# Patient Record
Sex: Female | Born: 1960 | Race: White | Hispanic: No | Marital: Married
Health system: Southern US, Community
[De-identification: ages and names within clinical notes are randomized; demographics above are authoritative.]

## PROBLEM LIST (undated history)

## (undated) HISTORY — PX: AUGMENTATION MAMMAPLASTY: SUR837

---

## 2018-02-11 ENCOUNTER — Other Ambulatory Visit: Payer: Self-pay | Admitting: Obstetrics & Gynecology

## 2018-02-11 DIAGNOSIS — Z1231 Encounter for screening mammogram for malignant neoplasm of breast: Secondary | ICD-10-CM

## 2018-03-07 ENCOUNTER — Ambulatory Visit
Admission: RE | Admit: 2018-03-07 | Discharge: 2018-03-07 | Disposition: A | Discharge status: Home / Self Care | Payer: Managed Care, Other (non HMO) | Source: Ambulatory Visit | Attending: Obstetrics & Gynecology | Admitting: Obstetrics & Gynecology

## 2018-03-07 DIAGNOSIS — Z1231 Encounter for screening mammogram for malignant neoplasm of breast: Secondary | ICD-10-CM

## 2018-03-14 ENCOUNTER — Other Ambulatory Visit: Payer: Self-pay | Admitting: Gastroenterology

## 2018-03-14 DIAGNOSIS — R140 Abdominal distension (gaseous): Secondary | ICD-10-CM

## 2018-03-14 DIAGNOSIS — R1084 Generalized abdominal pain: Secondary | ICD-10-CM

## 2018-03-29 ENCOUNTER — Ambulatory Visit
Admission: RE | Admit: 2018-03-29 | Discharge: 2018-03-29 | Disposition: A | Discharge status: Home / Self Care | Payer: Managed Care, Other (non HMO) | Source: Ambulatory Visit | Attending: Gastroenterology | Admitting: Gastroenterology

## 2018-03-29 DIAGNOSIS — R140 Abdominal distension (gaseous): Secondary | ICD-10-CM

## 2018-03-29 DIAGNOSIS — R1084 Generalized abdominal pain: Secondary | ICD-10-CM

## 2018-05-19 ENCOUNTER — Other Ambulatory Visit (HOSPITAL_COMMUNITY): Payer: Self-pay | Admitting: Gastroenterology

## 2018-05-19 DIAGNOSIS — R1111 Vomiting without nausea: Secondary | ICD-10-CM

## 2018-06-07 ENCOUNTER — Ambulatory Visit (HOSPITAL_COMMUNITY)
Admission: RE | Admit: 2018-06-07 | Discharge: 2018-06-07 | Disposition: A | Discharge status: Home / Self Care | Payer: Managed Care, Other (non HMO) | Source: Ambulatory Visit | Attending: Gastroenterology | Admitting: Gastroenterology

## 2018-06-07 DIAGNOSIS — R1111 Vomiting without nausea: Secondary | ICD-10-CM | POA: Diagnosis not present

## 2018-06-07 MED ORDER — TECHNETIUM TC 99M SULFUR COLLOID
2.1000 | Freq: Once | INTRAVENOUS | Status: AC | PRN
  Administered 2018-06-07: 2.1 via ORAL

## 2018-11-27 IMAGING — US US PELVIS COMPLETE TRANSABD/TRANSVAG
1 series · 14 of 25 positions shown · non-contrast
Comparison: None

CLINICAL DATA: Patient with abdominal pain and bloating.

EXAM:
TRANSABDOMINAL AND TRANSVAGINAL ULTRASOUND OF PELVIS
TECHNIQUE: Both transabdominal and transvaginal ultrasound examinations of the
pelvis were performed. Transabdominal technique was performed for
global imaging of the pelvis including uterus, ovaries, adnexal
regions, and pelvic cul-de-sac. It was necessary to proceed with
endovaginal exam following the transabdominal exam to visualize the
endometrium and adnexal structures.

[Series 1: us pelvis complete transabd/transvag · 0.18mm/px · 14 of 63 slices shown]
[im 1/63]
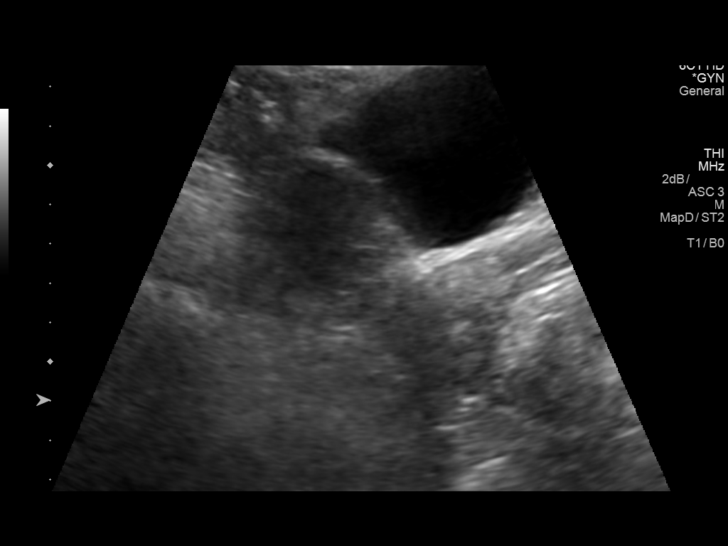
[im 6/63]
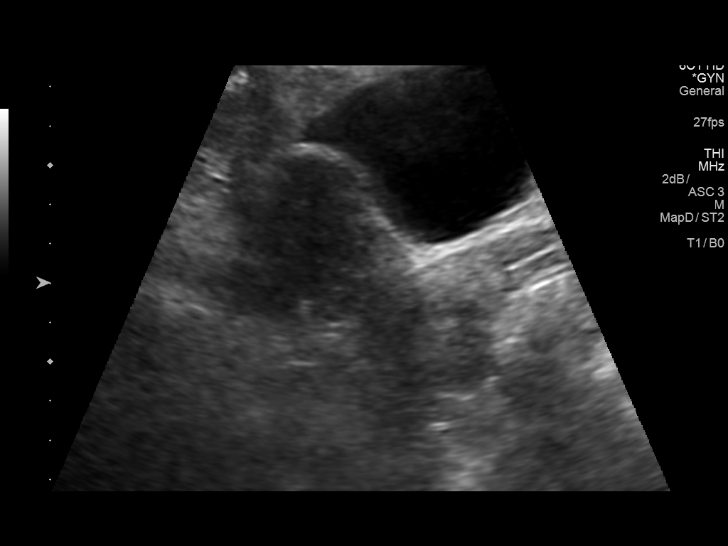
[im 11/63]
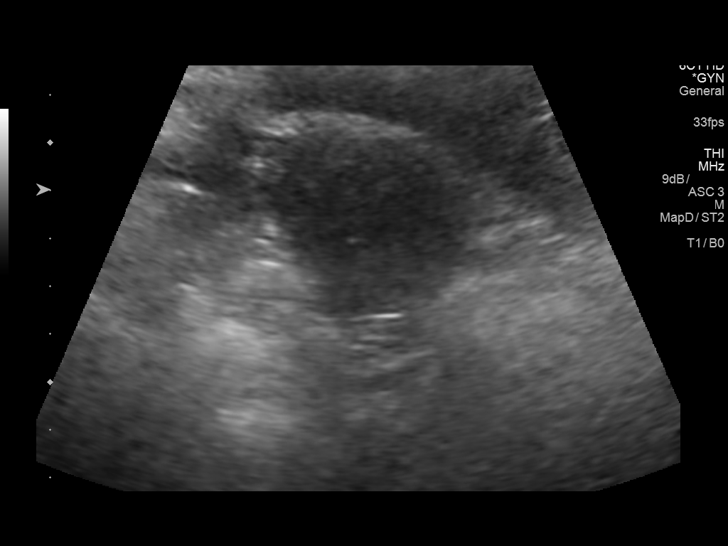
[im 16/63]
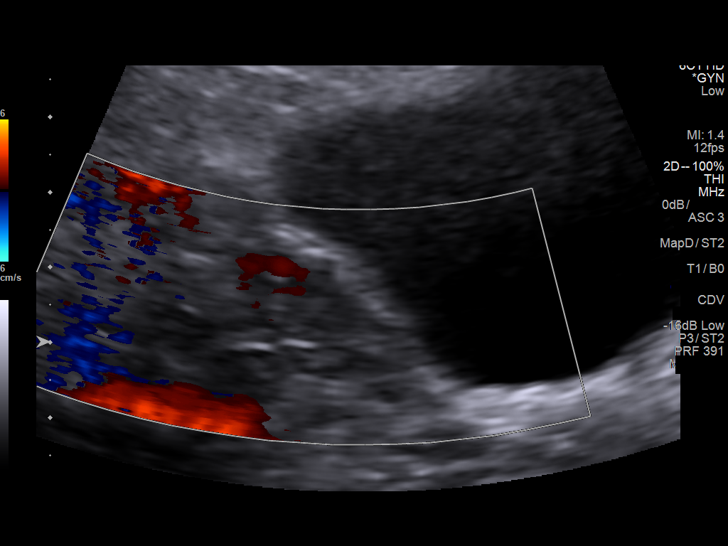
[im 21/63]
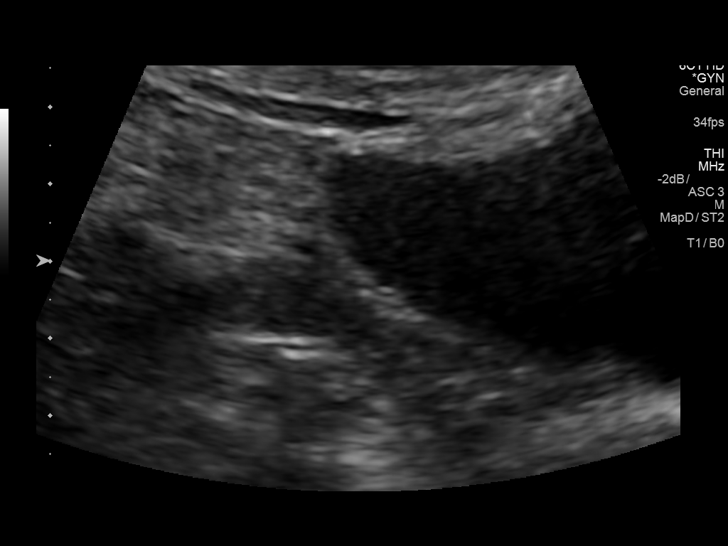
[im 24/63]
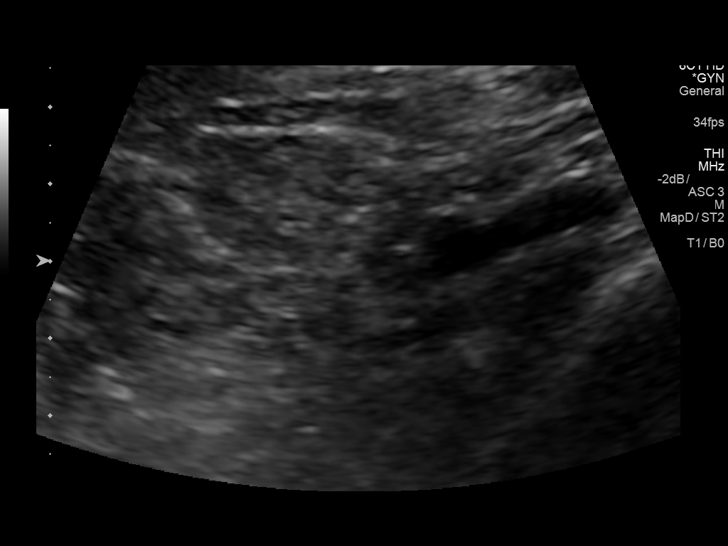
[im 29/63]
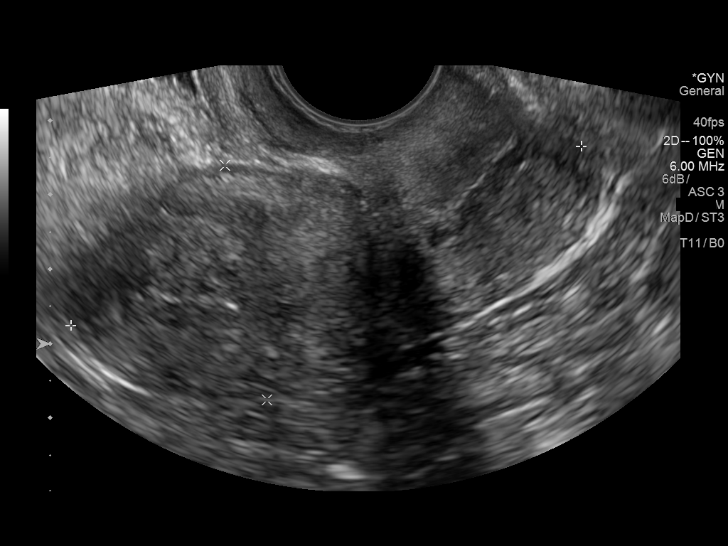
[im 34/63]
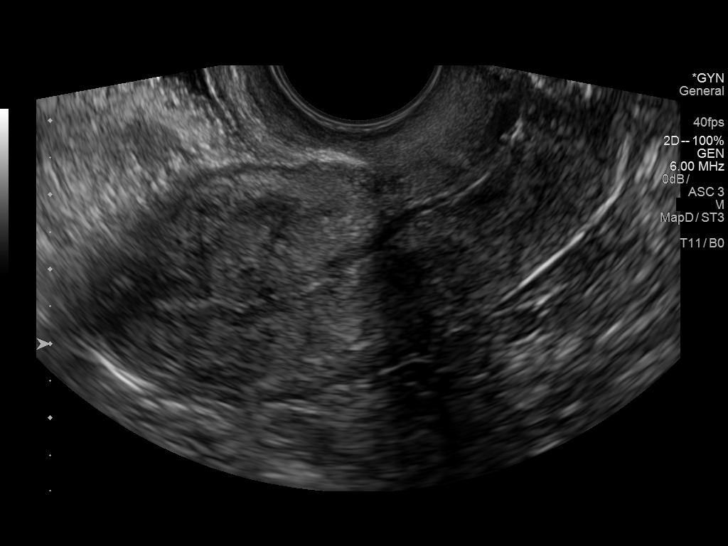
[im 39/63]
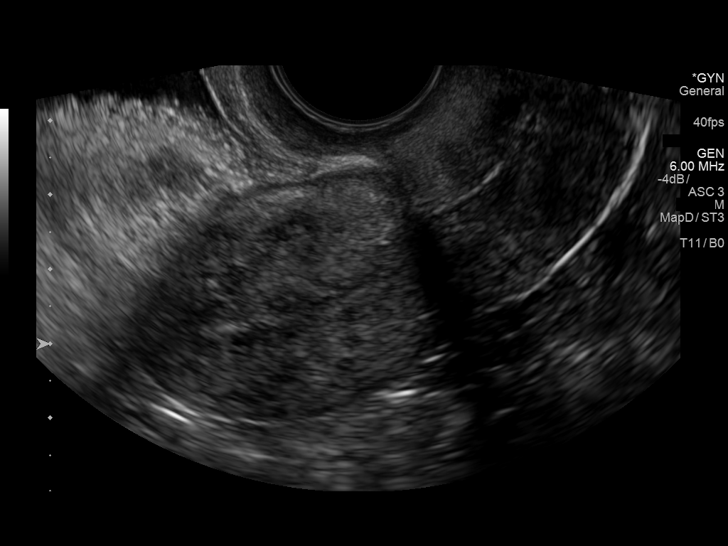
[im 42/63]
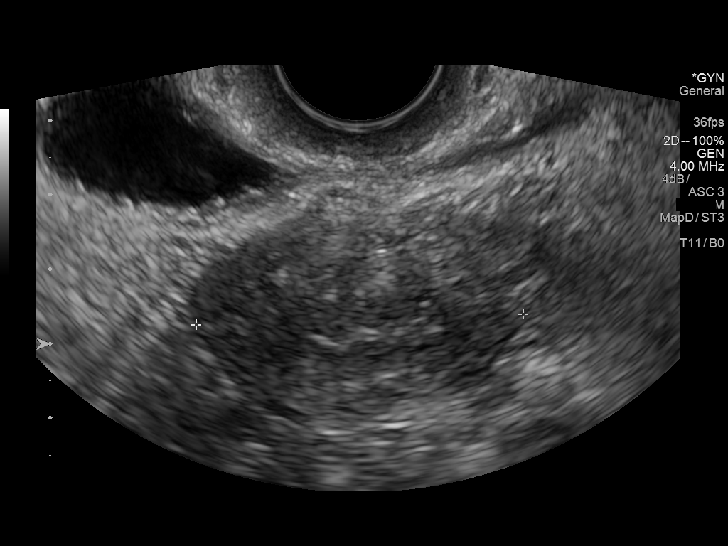
[im 47/63]
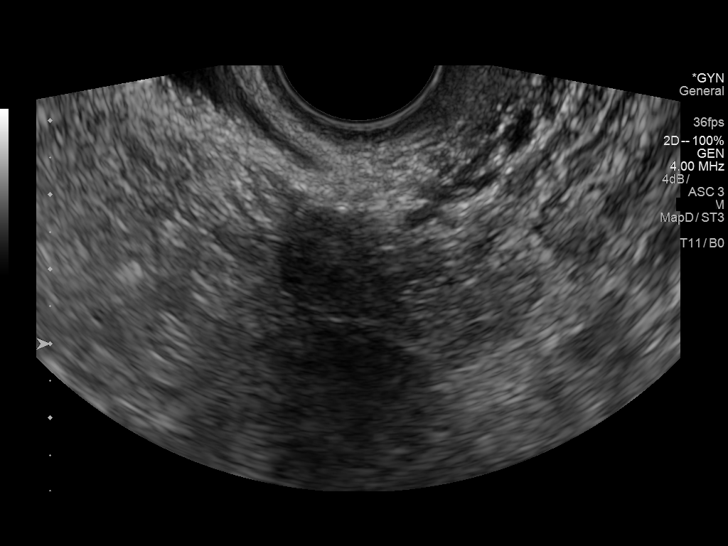
[im 52/63]
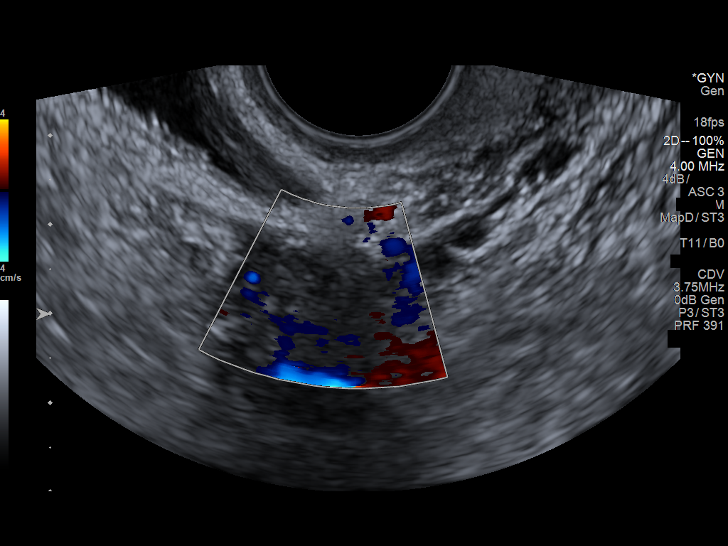
[im 57/63]
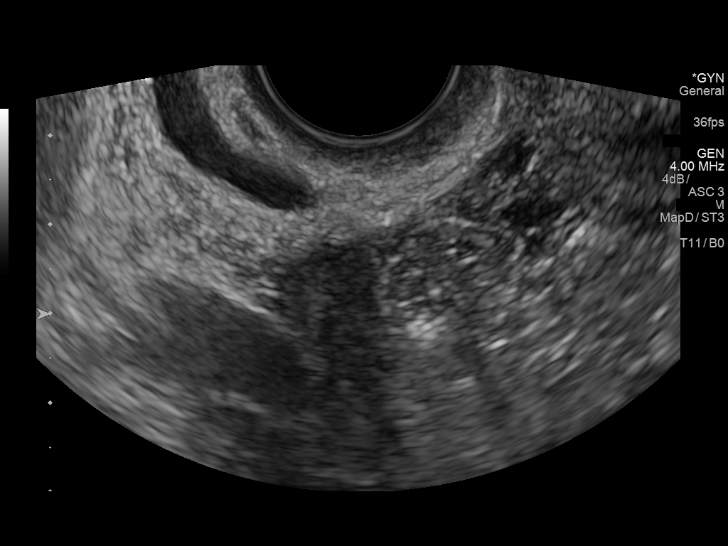
[im 63/63]
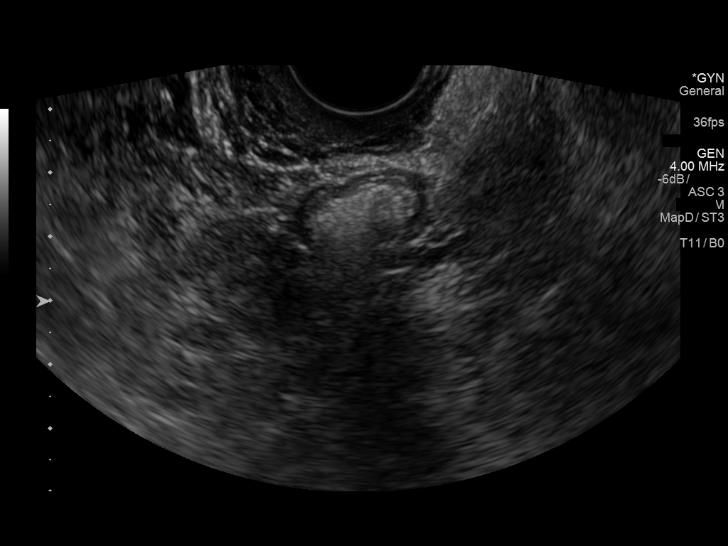

[14 of 25 positions shown; findings below may reference images not displayed]

FINDINGS: Uterus

Measurements: 7.3 x 3.2 x 4.4 cm. No fibroids or other mass
visualized.

Endometrium

Thickness: 2 mm.  No focal abnormality visualized.

Right ovary

Measurements: 2.4 x 1.5 x 1.4 cm. Normal appearance/no adnexal mass.

Left ovary

Not visualized.

Other findings

No abnormal free fluid.
IMPRESSION: No acute process within the pelvis.

## 2019-05-18 ENCOUNTER — Other Ambulatory Visit: Payer: Self-pay | Admitting: Obstetrics & Gynecology

## 2019-05-18 DIAGNOSIS — Z1231 Encounter for screening mammogram for malignant neoplasm of breast: Secondary | ICD-10-CM

## 2019-07-03 ENCOUNTER — Other Ambulatory Visit: Payer: Self-pay

## 2019-07-03 ENCOUNTER — Ambulatory Visit
Admission: RE | Admit: 2019-07-03 | Discharge: 2019-07-03 | Disposition: A | Discharge status: Home / Self Care | Payer: Managed Care, Other (non HMO) | Source: Ambulatory Visit | Attending: Obstetrics & Gynecology | Admitting: Obstetrics & Gynecology

## 2019-07-03 DIAGNOSIS — Z1231 Encounter for screening mammogram for malignant neoplasm of breast: Secondary | ICD-10-CM

## 2020-02-02 ENCOUNTER — Ambulatory Visit: Payer: Self-pay | Attending: Internal Medicine

## 2020-02-02 DIAGNOSIS — Z23 Encounter for immunization: Secondary | ICD-10-CM

## 2020-02-02 NOTE — Progress Notes (Signed)
   Covid-[facility] Vaccination [facility]  Name:  Patricia Doyle    MRN: [id number] DOB: Sep 19, 1961  02/02/2020  [name] was observed post Covid-19 immunization for 15 minutes without incident. She was provided with Vaccine Information Sheet and instruction to access the V-Safe system.   [name] was instructed to call 911 with any severe reactions post vaccine: Marland Kitchen Difficulty breathing  . Swelling of face and throat  . A fast heartbeat  . A bad rash all over body  . Dizziness and weakness   Immunizations Administered    Name Date Dose VIS Date Route   Pfizer COVID-19 Vaccine 02/02/2020 12:42 PM 0.3 mL 10/13/2019 Intramuscular   Manufacturer: [employer], [employer]   Lot: [id number]   NDC: [address]-0

## 2020-02-26 ENCOUNTER — Ambulatory Visit: Payer: Self-pay | Attending: Internal Medicine

## 2020-02-26 DIAGNOSIS — Z23 Encounter for immunization: Secondary | ICD-10-CM

## 2020-02-26 NOTE — Progress Notes (Signed)
   Covid-19 Vaccination Clinic  Name:  Patricia Doyle    MRN: [id number] DOB: [birth year]  02/26/2020  [name] was observed post Covid-19 immunization for 15 minutes without incident. She was provided with Vaccine Information Sheet and instruction to access the V-Safe system.   [name] was instructed to call 911 with any severe reactions post vaccine: Marland Kitchen Difficulty breathing  . Swelling of face and throat  . A fast heartbeat  . A bad rash all over body  . Dizziness and weakness   Immunizations Administered    Name Date Dose VIS Date Route   Pfizer COVID-19 Vaccine 02/26/2020  1:42 PM 0.3 mL 12/27/2018 Intramuscular   Manufacturer: [employer], [employer]   Lot: [id number]   NDC: [address]-1

## 2020-03-19 ENCOUNTER — Other Ambulatory Visit: Payer: Self-pay | Admitting: Family Medicine

## 2020-03-19 DIAGNOSIS — S92902S Unspecified fracture of left foot, sequela: Secondary | ICD-10-CM

## 2020-03-19 DIAGNOSIS — E2839 Other primary ovarian failure: Secondary | ICD-10-CM

## 2020-06-04 ENCOUNTER — Other Ambulatory Visit: Payer: Self-pay | Admitting: Obstetrics & Gynecology

## 2020-06-04 DIAGNOSIS — Z1231 Encounter for screening mammogram for malignant neoplasm of breast: Secondary | ICD-10-CM

## 2020-06-12 ENCOUNTER — Other Ambulatory Visit: Payer: Self-pay

## 2020-06-12 ENCOUNTER — Ambulatory Visit
Admission: RE | Admit: 2020-06-12 | Discharge: 2020-06-12 | Disposition: A | Discharge status: Home / Self Care | Payer: 59 | Source: Ambulatory Visit | Attending: Family Medicine | Admitting: Family Medicine

## 2020-06-12 DIAGNOSIS — E2839 Other primary ovarian failure: Secondary | ICD-10-CM

## 2020-06-12 DIAGNOSIS — S92902S Unspecified fracture of left foot, sequela: Secondary | ICD-10-CM

## 2020-07-09 ENCOUNTER — Ambulatory Visit: Payer: Self-pay

## 2020-07-11 ENCOUNTER — Ambulatory Visit
Admission: RE | Admit: 2020-07-11 | Discharge: 2020-07-11 | Disposition: A | Discharge status: Home / Self Care | Payer: 59 | Source: Ambulatory Visit | Attending: Obstetrics & Gynecology | Admitting: Obstetrics & Gynecology

## 2020-07-11 ENCOUNTER — Other Ambulatory Visit: Payer: Self-pay

## 2020-07-11 DIAGNOSIS — Z1231 Encounter for screening mammogram for malignant neoplasm of breast: Secondary | ICD-10-CM

## 2020-07-15 ENCOUNTER — Other Ambulatory Visit: Payer: Self-pay | Admitting: Obstetrics & Gynecology

## 2020-07-15 DIAGNOSIS — R928 Other abnormal and inconclusive findings on diagnostic imaging of breast: Secondary | ICD-10-CM

## 2020-07-26 ENCOUNTER — Ambulatory Visit
Admission: RE | Admit: 2020-07-26 | Discharge: 2020-07-26 | Disposition: A | Discharge status: Home / Self Care | Payer: 59 | Source: Ambulatory Visit | Attending: Obstetrics & Gynecology | Admitting: Obstetrics & Gynecology

## 2020-07-26 ENCOUNTER — Other Ambulatory Visit: Payer: Self-pay | Admitting: Obstetrics & Gynecology

## 2020-07-26 ENCOUNTER — Other Ambulatory Visit: Payer: Self-pay

## 2020-07-26 DIAGNOSIS — R928 Other abnormal and inconclusive findings on diagnostic imaging of breast: Secondary | ICD-10-CM

## 2021-09-04 ENCOUNTER — Other Ambulatory Visit: Payer: Self-pay | Admitting: Family Medicine

## 2021-09-04 DIAGNOSIS — Z1231 Encounter for screening mammogram for malignant neoplasm of breast: Secondary | ICD-10-CM

## 2021-10-16 ENCOUNTER — Other Ambulatory Visit: Payer: Self-pay | Admitting: Obstetrics and Gynecology

## 2021-10-16 ENCOUNTER — Ambulatory Visit
Admission: RE | Admit: 2021-10-16 | Discharge: 2021-10-16 | Disposition: A | Discharge status: Home / Self Care | Payer: 59 | Source: Ambulatory Visit | Attending: Family Medicine | Admitting: Family Medicine

## 2021-10-16 DIAGNOSIS — Z1231 Encounter for screening mammogram for malignant neoplasm of breast: Secondary | ICD-10-CM

## 2021-11-19 DIAGNOSIS — Z6827 Body mass index (BMI) 27.0-27.9, adult: Secondary | ICD-10-CM | POA: Diagnosis not present

## 2021-11-19 DIAGNOSIS — Z1231 Encounter for screening mammogram for malignant neoplasm of breast: Secondary | ICD-10-CM | POA: Diagnosis not present

## 2021-11-19 DIAGNOSIS — Z01419 Encounter for gynecological examination (general) (routine) without abnormal findings: Secondary | ICD-10-CM | POA: Diagnosis not present

## 2021-11-19 DIAGNOSIS — Z1211 Encounter for screening for malignant neoplasm of colon: Secondary | ICD-10-CM | POA: Diagnosis not present

## 2021-11-19 DIAGNOSIS — R6882 Decreased libido: Secondary | ICD-10-CM | POA: Diagnosis not present

## 2021-11-19 DIAGNOSIS — Z79890 Hormone replacement therapy (postmenopausal): Secondary | ICD-10-CM | POA: Diagnosis not present

## 2021-11-19 DIAGNOSIS — R69 Illness, unspecified: Secondary | ICD-10-CM | POA: Diagnosis not present

## 2022-02-03 ENCOUNTER — Ambulatory Visit
Admission: RE | Admit: 2022-02-03 | Discharge: 2022-02-03 | Disposition: A | Discharge status: Home / Self Care | Payer: 59 | Source: Ambulatory Visit | Attending: Family Medicine | Admitting: Family Medicine

## 2022-02-03 ENCOUNTER — Other Ambulatory Visit: Payer: Self-pay | Admitting: Family Medicine

## 2022-02-03 DIAGNOSIS — R52 Pain, unspecified: Secondary | ICD-10-CM

## 2022-02-03 DIAGNOSIS — G4700 Insomnia, unspecified: Secondary | ICD-10-CM | POA: Diagnosis not present

## 2022-02-03 DIAGNOSIS — Z79890 Hormone replacement therapy (postmenopausal): Secondary | ICD-10-CM | POA: Diagnosis not present

## 2022-02-03 DIAGNOSIS — I10 Essential (primary) hypertension: Secondary | ICD-10-CM | POA: Diagnosis not present

## 2022-02-03 DIAGNOSIS — Z0000 Encounter for general adult medical examination without abnormal findings: Secondary | ICD-10-CM | POA: Diagnosis not present

## 2022-02-03 DIAGNOSIS — Z5181 Encounter for therapeutic drug level monitoring: Secondary | ICD-10-CM | POA: Diagnosis not present

## 2022-02-03 DIAGNOSIS — R739 Hyperglycemia, unspecified: Secondary | ICD-10-CM | POA: Diagnosis not present

## 2022-02-03 DIAGNOSIS — E538 Deficiency of other specified B group vitamins: Secondary | ICD-10-CM | POA: Diagnosis not present

## 2022-02-03 DIAGNOSIS — Z8262 Family history of osteoporosis: Secondary | ICD-10-CM | POA: Diagnosis not present

## 2022-02-03 DIAGNOSIS — M545 Low back pain, unspecified: Secondary | ICD-10-CM | POA: Diagnosis not present

## 2022-02-03 DIAGNOSIS — K2270 Barrett's esophagus without dysplasia: Secondary | ICD-10-CM | POA: Diagnosis not present

## 2022-08-04 DIAGNOSIS — D649 Anemia, unspecified: Secondary | ICD-10-CM | POA: Diagnosis not present

## 2022-09-21 ENCOUNTER — Other Ambulatory Visit: Payer: Self-pay | Admitting: Obstetrics and Gynecology

## 2022-09-21 DIAGNOSIS — Z1231 Encounter for screening mammogram for malignant neoplasm of breast: Secondary | ICD-10-CM

## 2022-11-19 ENCOUNTER — Ambulatory Visit: Payer: 59

## 2022-11-24 DIAGNOSIS — N941 Unspecified dyspareunia: Secondary | ICD-10-CM | POA: Diagnosis not present

## 2022-11-24 DIAGNOSIS — N898 Other specified noninflammatory disorders of vagina: Secondary | ICD-10-CM | POA: Diagnosis not present

## 2022-11-24 DIAGNOSIS — Z01419 Encounter for gynecological examination (general) (routine) without abnormal findings: Secondary | ICD-10-CM | POA: Diagnosis not present

## 2022-11-24 DIAGNOSIS — Z6827 Body mass index (BMI) 27.0-27.9, adult: Secondary | ICD-10-CM | POA: Diagnosis not present

## 2022-11-24 DIAGNOSIS — Z79890 Hormone replacement therapy (postmenopausal): Secondary | ICD-10-CM | POA: Diagnosis not present

## 2022-11-24 DIAGNOSIS — Z1231 Encounter for screening mammogram for malignant neoplasm of breast: Secondary | ICD-10-CM | POA: Diagnosis not present

## 2022-11-24 DIAGNOSIS — Z1211 Encounter for screening for malignant neoplasm of colon: Secondary | ICD-10-CM | POA: Diagnosis not present

## 2022-12-31 DIAGNOSIS — N952 Postmenopausal atrophic vaginitis: Secondary | ICD-10-CM | POA: Diagnosis not present

## 2023-02-09 DIAGNOSIS — D649 Anemia, unspecified: Secondary | ICD-10-CM | POA: Diagnosis not present

## 2023-02-09 DIAGNOSIS — E538 Deficiency of other specified B group vitamins: Secondary | ICD-10-CM | POA: Diagnosis not present

## 2023-02-09 DIAGNOSIS — I10 Essential (primary) hypertension: Secondary | ICD-10-CM | POA: Diagnosis not present

## 2023-05-03 DIAGNOSIS — L814 Other melanin hyperpigmentation: Secondary | ICD-10-CM | POA: Diagnosis not present

## 2023-05-03 DIAGNOSIS — L821 Other seborrheic keratosis: Secondary | ICD-10-CM | POA: Diagnosis not present

## 2023-05-04 DIAGNOSIS — R0981 Nasal congestion: Secondary | ICD-10-CM | POA: Diagnosis not present

## 2023-11-11 ENCOUNTER — Other Ambulatory Visit: Payer: Self-pay | Admitting: Obstetrics and Gynecology

## 2023-11-11 DIAGNOSIS — Z1231 Encounter for screening mammogram for malignant neoplasm of breast: Secondary | ICD-10-CM

## 2024-01-11 ENCOUNTER — Ambulatory Visit: Payer: 59

## 2024-02-01 ENCOUNTER — Ambulatory Visit
Admission: RE | Admit: 2024-02-01 | Discharge: 2024-02-01 | Disposition: A | Discharge status: Home / Self Care | Source: Ambulatory Visit | Attending: Obstetrics and Gynecology | Admitting: Obstetrics and Gynecology

## 2024-02-01 DIAGNOSIS — Z1231 Encounter for screening mammogram for malignant neoplasm of breast: Secondary | ICD-10-CM
